# Patient Record
Sex: Female | Born: 1960 | Race: Black or African American | Hispanic: No | Marital: Married | Smoking: Never smoker
Health system: Southern US, Community
[De-identification: ages and names within clinical notes are randomized; demographics above are authoritative.]

## PROBLEM LIST (undated history)

## (undated) DIAGNOSIS — E669 Obesity, unspecified: Secondary | ICD-10-CM

## (undated) DIAGNOSIS — J45909 Unspecified asthma, uncomplicated: Secondary | ICD-10-CM

## (undated) DIAGNOSIS — I10 Essential (primary) hypertension: Secondary | ICD-10-CM

## (undated) HISTORY — PX: TONSILLECTOMY: SUR1361

## (undated) HISTORY — PX: CHOLECYSTECTOMY: SHX55

## (undated) HISTORY — DX: Obesity, unspecified: E66.9

## (undated) HISTORY — PX: SHOULDER SURGERY: SHX246

## (undated) HISTORY — PX: ABDOMINAL HYSTERECTOMY: SHX81

## (undated) HISTORY — DX: Unspecified asthma, uncomplicated: J45.909

---

## 2003-03-13 ENCOUNTER — Encounter: Admission: RE | Admit: 2003-03-13 | Discharge: 2003-03-13 | Payer: Self-pay | Admitting: Internal Medicine

## 2003-06-13 ENCOUNTER — Other Ambulatory Visit: Admission: RE | Admit: 2003-06-13 | Discharge: 2003-06-13 | Payer: Self-pay | Admitting: Internal Medicine

## 2003-12-14 ENCOUNTER — Inpatient Hospital Stay (HOSPITAL_COMMUNITY): Admission: AD | Admit: 2003-12-14 | Discharge: 2003-12-14 | Payer: Self-pay | Admitting: *Deleted

## 2004-06-19 ENCOUNTER — Encounter: Admission: RE | Admit: 2004-06-19 | Discharge: 2004-06-19 | Payer: Self-pay | Admitting: Internal Medicine

## 2004-07-03 ENCOUNTER — Encounter: Admission: RE | Admit: 2004-07-03 | Discharge: 2004-07-03 | Payer: Self-pay | Admitting: Internal Medicine

## 2004-07-15 ENCOUNTER — Ambulatory Visit (HOSPITAL_COMMUNITY): Admission: RE | Admit: 2004-07-15 | Discharge: 2004-07-15 | Payer: Self-pay | Admitting: Gastroenterology

## 2006-12-26 ENCOUNTER — Other Ambulatory Visit: Admission: RE | Admit: 2006-12-26 | Discharge: 2006-12-26 | Payer: Self-pay | Admitting: Obstetrics and Gynecology

## 2007-01-03 ENCOUNTER — Encounter: Admission: RE | Admit: 2007-01-03 | Discharge: 2007-01-03 | Payer: Self-pay | Admitting: Internal Medicine

## 2008-01-05 ENCOUNTER — Encounter: Admission: RE | Admit: 2008-01-05 | Discharge: 2008-01-05 | Payer: Self-pay | Admitting: Internal Medicine

## 2008-07-01 ENCOUNTER — Encounter: Admission: RE | Admit: 2008-07-01 | Discharge: 2008-07-01 | Payer: Self-pay | Admitting: Internal Medicine

## 2009-01-06 ENCOUNTER — Encounter: Admission: RE | Admit: 2009-01-06 | Discharge: 2009-01-06 | Payer: Self-pay | Admitting: Internal Medicine

## 2010-01-14 ENCOUNTER — Encounter
Admission: RE | Admit: 2010-01-14 | Discharge: 2010-01-14 | Payer: Self-pay | Source: Home / Self Care | Attending: Internal Medicine | Admitting: Internal Medicine

## 2010-03-24 ENCOUNTER — Other Ambulatory Visit: Payer: Self-pay | Admitting: Obstetrics and Gynecology

## 2010-03-24 ENCOUNTER — Other Ambulatory Visit (HOSPITAL_COMMUNITY)
Admission: RE | Admit: 2010-03-24 | Discharge: 2010-03-24 | Disposition: A | Discharge status: Home / Self Care | Payer: BC Managed Care – PPO | Source: Ambulatory Visit | Attending: Obstetrics and Gynecology | Admitting: Obstetrics and Gynecology

## 2010-03-24 DIAGNOSIS — Z01419 Encounter for gynecological examination (general) (routine) without abnormal findings: Secondary | ICD-10-CM | POA: Insufficient documentation

## 2010-06-12 NOTE — Op Note (Signed)
Christy Reed, Christy Reed              ACCOUNT NO.:  [id number]   MEDICAL RECORD NO.:  [id number]          PATIENT TYPE:  AMB   LOCATION:  ENDO                         FACILITY:  [facility]   PHYSICIAN:  Danise Edge, M.D.   DATE OF BIRTH:  1960-12-09   DATE OF PROCEDURE:  07/15/2004  DATE OF DISCHARGE:                                 OPERATIVE REPORT   PROCEDURE:  Esophagogastroduodenoscopy.   INDICATIONS:  Ms. Christy Reed is a 50 year old female born April 03, 1960. She underwent a Nissen fundoplication in July 2001 while living in  [place], [place]. She also underwent a cholecystectomy.   Ms. Christy Reed has intermittent solid food dysphagia which is minimal. After  meals, she experiences sharp epigastric pain with nausea. She reports no  odynophagia. There is no history of peptic ulcer disease.   MEDICATION ALLERGIES:  SULFA.   CHRONIC MEDICATIONS:  Diovan and Zyrtec.   PAST MEDICAL - SURGICAL HISTORY:  Hypertension, seasonal rhinitis,  gastroesophageal reflux, Nissen fundoplication July 2001, tinnitus,  cholecystectomy, total abdominal hysterectomy, right bunion surgery,  tonsillectomy, left rotator cuff repair January 2003, colonoscopy 2002.   ENDOSCOPIST:  Danise Edge, M.D.   PREMEDICATION:  Versed 5 mg, Demerol 30 mg.   DESCRIPTION OF PROCEDURE:  After obtaining informed consent, Ms. Christy Reed was  placed in the left lateral decubitus position. I administered intravenous  Demerol and intravenous Versed to achieve conscious sedation for the  procedure. The patient's blood pressure, oxygen saturation and cardiac  rhythm were monitored throughout the procedure and documented in the medical  record.   The Olympus gastroscope was passed through the posterior hypopharynx into  the proximal esophagus without difficulty. The hypopharynx appeared normal.  I was unable to visualize the vocal cords.   ESOPHAGOSCOPY:  The proximal, mid and lower segments of the esophageal  mucosa appear completely normal post Nissen fundoplication. The  squamocolumnar junction and esophagogastric junction are noted at 39 cm from  the incisor teeth. There is no endoscopic evidence for the presence of  Barrett's esophagus, erosive esophagitis or esophageal mucosal scarring.   GASTROSCOPY:  Retroflexed view of the gastric fundus was normal. Retroflexed  view of the gastric cardia reveals an intact fundoplication. The gastric  body, antrum and pylorus appear completely normal.   DUODENOSCOPY:  The duodenal bulb, mid duodenum and distal duodenum appear  normal.   ASSESSMENT:  Normal esophagogastroduodenoscopy post Nissen fundoplication in  July 2001 to treat gastroesophageal reflux.       MJ/MEDQ  D:  07/15/2004  T:  07/15/2004  Job:  [id number]   cc:   Georgann Housekeeper, MD  [address]  [place]  [place] [address]  Fax: [phone]

## 2011-01-15 ENCOUNTER — Other Ambulatory Visit: Payer: Self-pay | Admitting: Internal Medicine

## 2011-01-15 DIAGNOSIS — Z1231 Encounter for screening mammogram for malignant neoplasm of breast: Secondary | ICD-10-CM

## 2011-02-17 ENCOUNTER — Ambulatory Visit: Payer: BC Managed Care – PPO

## 2011-02-18 ENCOUNTER — Ambulatory Visit
Admission: RE | Admit: 2011-02-18 | Discharge: 2011-02-18 | Disposition: A | Discharge status: Home / Self Care | Payer: BC Managed Care – PPO | Source: Ambulatory Visit | Attending: Internal Medicine | Admitting: Internal Medicine

## 2011-02-18 DIAGNOSIS — Z1231 Encounter for screening mammogram for malignant neoplasm of breast: Secondary | ICD-10-CM

## 2012-02-02 ENCOUNTER — Other Ambulatory Visit: Payer: Self-pay | Admitting: Internal Medicine

## 2012-02-02 DIAGNOSIS — Z1231 Encounter for screening mammogram for malignant neoplasm of breast: Secondary | ICD-10-CM

## 2012-03-01 ENCOUNTER — Ambulatory Visit
Admission: RE | Admit: 2012-03-01 | Discharge: 2012-03-01 | Disposition: A | Discharge status: Home / Self Care | Payer: BC Managed Care – PPO | Source: Ambulatory Visit | Attending: Internal Medicine | Admitting: Internal Medicine

## 2012-03-01 DIAGNOSIS — Z1231 Encounter for screening mammogram for malignant neoplasm of breast: Secondary | ICD-10-CM

## 2012-05-03 ENCOUNTER — Emergency Department (HOSPITAL_COMMUNITY)
Admission: EM | Admit: 2012-05-03 | Discharge: 2012-05-03 | Disposition: A | Discharge status: Home / Self Care | Payer: BC Managed Care – PPO | Source: Home / Self Care | Attending: Emergency Medicine | Admitting: Emergency Medicine

## 2012-05-03 ENCOUNTER — Encounter (HOSPITAL_COMMUNITY): Payer: Self-pay | Admitting: Emergency Medicine

## 2012-05-03 DIAGNOSIS — IMO0002 Reserved for concepts with insufficient information to code with codable children: Secondary | ICD-10-CM

## 2012-05-03 DIAGNOSIS — T148XXA Other injury of unspecified body region, initial encounter: Secondary | ICD-10-CM

## 2012-05-03 HISTORY — DX: Essential (primary) hypertension: I10
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
## 2013-02-20 ENCOUNTER — Other Ambulatory Visit: Payer: Self-pay

## 2013-02-20 DIAGNOSIS — Z1231 Encounter for screening mammogram for malignant neoplasm of breast: Secondary | ICD-10-CM

## 2013-03-12 ENCOUNTER — Ambulatory Visit
Admission: RE | Admit: 2013-03-12 | Discharge: 2013-03-12 | Disposition: A | Discharge status: Home / Self Care | Payer: BC Managed Care – PPO | Source: Ambulatory Visit

## 2013-03-12 DIAGNOSIS — Z1231 Encounter for screening mammogram for malignant neoplasm of breast: Secondary | ICD-10-CM

## 2013-04-03 ENCOUNTER — Other Ambulatory Visit: Payer: Self-pay | Admitting: Obstetrics and Gynecology

## 2013-04-03 ENCOUNTER — Other Ambulatory Visit (HOSPITAL_COMMUNITY)
Admission: RE | Admit: 2013-04-03 | Discharge: 2013-04-03 | Disposition: A | Discharge status: Home / Self Care | Payer: BC Managed Care – PPO | Source: Ambulatory Visit | Attending: Obstetrics and Gynecology | Admitting: Obstetrics and Gynecology

## 2013-04-03 DIAGNOSIS — Z01419 Encounter for gynecological examination (general) (routine) without abnormal findings: Secondary | ICD-10-CM | POA: Insufficient documentation

## 2013-04-03 DIAGNOSIS — Z1151 Encounter for screening for human papillomavirus (HPV): Secondary | ICD-10-CM | POA: Insufficient documentation

## 2013-09-03 ENCOUNTER — Ambulatory Visit
Admission: RE | Admit: 2013-09-03 | Discharge: 2013-09-03 | Disposition: A | Discharge status: Home / Self Care | Payer: BC Managed Care – PPO | Source: Ambulatory Visit | Attending: Internal Medicine | Admitting: Internal Medicine

## 2013-09-03 ENCOUNTER — Other Ambulatory Visit: Payer: Self-pay | Admitting: Internal Medicine

## 2013-09-03 DIAGNOSIS — M549 Dorsalgia, unspecified: Secondary | ICD-10-CM

## 2013-10-31 ENCOUNTER — Encounter (HOSPITAL_COMMUNITY): Payer: Self-pay | Admitting: Emergency Medicine

## 2013-10-31 ENCOUNTER — Emergency Department (INDEPENDENT_AMBULATORY_CARE_PROVIDER_SITE_OTHER)
Admission: EM | Admit: 2013-10-31 | Discharge: 2013-10-31 | Disposition: A | Discharge status: Home / Self Care | Payer: Worker's Compensation | Source: Home / Self Care | Attending: Family Medicine | Admitting: Family Medicine

## 2013-10-31 DIAGNOSIS — M542 Cervicalgia: Secondary | ICD-10-CM

## 2013-10-31 DIAGNOSIS — S134XXA Sprain of ligaments of cervical spine, initial encounter: Secondary | ICD-10-CM

## 2013-10-31 MED ORDER — CYCLOBENZAPRINE HCL 10 MG PO TABS: ORAL_TABLET | ORAL | Status: AC

## 2013-10-31 MED ORDER — NAPROXEN 500 MG PO TABS
500.0000 mg | ORAL_TABLET | Freq: Two times a day (BID) | ORAL | Status: AC
Start: 2013-10-31 — End: ?
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
## 2013-11-02 NOTE — ED Provider Notes (Signed)
Medical screening examination/treatment/procedure(s) were performed by a resident physician or non-physician practitioner and as the supervising physician I was immediately available for consultation/collaboration.  Yaacov Koziol, MD    Jessa Stinson S Charmine Bockrath, MD 11/02/13 0817 

## 2014-02-25 ENCOUNTER — Other Ambulatory Visit: Payer: Self-pay

## 2014-02-25 DIAGNOSIS — Z1231 Encounter for screening mammogram for malignant neoplasm of breast: Secondary | ICD-10-CM

## 2014-03-13 ENCOUNTER — Ambulatory Visit
Admission: RE | Admit: 2014-03-13 | Discharge: 2014-03-13 | Disposition: A | Discharge status: Home / Self Care | Payer: BC Managed Care – PPO | Source: Ambulatory Visit

## 2014-03-13 ENCOUNTER — Encounter (INDEPENDENT_AMBULATORY_CARE_PROVIDER_SITE_OTHER): Payer: Self-pay

## 2014-03-13 DIAGNOSIS — Z1231 Encounter for screening mammogram for malignant neoplasm of breast: Secondary | ICD-10-CM

## 2014-03-15 ENCOUNTER — Encounter: Payer: BC Managed Care – PPO | Attending: Internal Medicine | Admitting: Dietician

## 2014-03-15 ENCOUNTER — Encounter: Payer: Self-pay | Admitting: Dietician

## 2014-03-15 DIAGNOSIS — E669 Obesity, unspecified: Secondary | ICD-10-CM

## 2014-03-15 DIAGNOSIS — Z713 Dietary counseling and surveillance: Secondary | ICD-10-CM | POA: Diagnosis present

## 2014-03-15 DIAGNOSIS — Z6839 Body mass index (BMI) 39.0-39.9, adult: Secondary | ICD-10-CM | POA: Diagnosis not present

## 2014-03-15 NOTE — Progress Notes (Signed)
Medical Nutrition Therapy:  Appt start time: 0930 end time:  1030.   Assessment:  Primary concerns today: obesity. 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  She is wanting to have breast reduction surgery and is here for some weight loss guidance.  In her 80s she weighed around 120 lbs,  in her 30s she weighed around 130 lbs, and she has gradually gained weight over time to her highest adult weight at 242 lbs.  She lives with her husband and her mom (who is sick) and her two sisters who moved in to help care for their mom.  Her two sisters do the grocery shopping and she does the majority of the cooking.  They eat in their own rooms or in front of the TV.  She likes to cook balanced meals with a starch, veggie, and meat.  Her sisters like to make fried foods.  She does not currently exercise and is looking to change jobs.  She tends to eat lightly throughout the day and overeat at night, and she states she craves sweet foods/desserts in the evenings. She also had an elevated glucose reading at her last doctor's visit and is awaiting results of HgA1c test.   Preferred Learning Style:  No preference indicated   Learning Readiness:   Contemplating  Ready  MEDICATIONS: see list   DIETARY INTAKE:  Usual eating pattern includes 1-2 meals and 2 snacks per day.  24-hr recall:  B ( AM): breakfast bar and water  Snk ( AM): none  L ( PM): pick up fast food, or make a sandwich at home or skips Snk ( PM): none D (7 PM): depends, either fried or baked protein (depending on who is cooking) with starch and veggie  Snk ( PM): cakes, pies, candy  Usual physical activity: currently none, likes Zumba and would like to find a class she could take, used to walk with a neighbor but gets cramps in legs at night  Estimated energy needs: 1200-1400 calories  Progress Towards Goal(s):  Not yet started   Nutritional Diagnosis:  [place]-3.3 Overweight/obesity As related to mindless  eating with majority of calories consumed later in the day and limited physical activity.  As evidenced by dietary recall, patient report, and BMI of 39.7.    Intervention:  Nutrition education and counseling.  Discussed realistic weight loss rates, recommended 1200-1500 calorie diet for 1-2 lbs of weight loss per week. Stated benefits of exercise, recommend she start a walking routine or begin Zumba at home on [email or web address] (patient liked this idea). Utilized MyPlate to demonstrate a healthy, balanced meal of lean protein, carbohydrate for energy, heart healthy fat, and non-starchy vegetables. Discussed lean protein options. Recommended eating every 3-4 hours to avoid being overly hungry at next meal/in the evening. 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  She also plans to eat lunch earlier (stop skipping) and take a snack on her afternoon route.  Hopefully this will curb her ravenous hunger in the evening by eating more calories earlier in the day with regular timed meals and snacks. Discussed mindful/intuitive eating strategies and recommended they begin eating meals at the table as a family without distractions like TV. Provided healthy snack ideas. Recommended setting small, attainable goals and celebrating successes.  Teaching Method Utilized: Visual Auditory Hands on  Handouts given during visit include:  MyPlate  Low Carb Snack Suggestions  Nutrition Strategies for Weight Loss  Barriers to learning/adherence to lifestyle change: none  Demonstrated degree of understanding via:  Teach Back   Monitoring/Evaluation:  Dietary intake, exercise, labs, and body weight in 3 month(s).

## 2014-04-08 ENCOUNTER — Other Ambulatory Visit: Payer: Self-pay | Admitting: Obstetrics and Gynecology

## 2014-04-08 ENCOUNTER — Other Ambulatory Visit (HOSPITAL_COMMUNITY)
Admission: RE | Admit: 2014-04-08 | Discharge: 2014-04-08 | Disposition: A | Discharge status: Home / Self Care | Payer: BC Managed Care – PPO | Source: Ambulatory Visit | Attending: Obstetrics and Gynecology | Admitting: Obstetrics and Gynecology

## 2014-04-08 DIAGNOSIS — Z01419 Encounter for gynecological examination (general) (routine) without abnormal findings: Secondary | ICD-10-CM | POA: Insufficient documentation

## 2014-04-09 LAB — CYTOLOGY - PAP

## 2014-06-14 ENCOUNTER — Ambulatory Visit: Payer: Self-pay | Admitting: Dietician

## 2014-06-17 ENCOUNTER — Ambulatory Visit: Payer: Self-pay | Admitting: Skilled Nursing Facility1

## 2015-04-01 ENCOUNTER — Ambulatory Visit
Admission: RE | Admit: 2015-04-01 | Discharge: 2015-04-01 | Disposition: A | Discharge status: Home / Self Care | Payer: BC Managed Care – PPO | Source: Ambulatory Visit | Attending: Internal Medicine | Admitting: Internal Medicine

## 2015-04-01 ENCOUNTER — Other Ambulatory Visit: Payer: Self-pay | Admitting: Internal Medicine

## 2015-04-01 DIAGNOSIS — M25552 Pain in left hip: Secondary | ICD-10-CM

## 2015-04-04 ENCOUNTER — Other Ambulatory Visit: Payer: Self-pay

## 2015-04-04 DIAGNOSIS — Z1231 Encounter for screening mammogram for malignant neoplasm of breast: Secondary | ICD-10-CM

## 2015-04-08 ENCOUNTER — Ambulatory Visit
Admission: RE | Admit: 2015-04-08 | Discharge: 2015-04-08 | Disposition: A | Discharge status: Home / Self Care | Payer: BC Managed Care – PPO | Source: Ambulatory Visit

## 2015-04-08 DIAGNOSIS — Z1231 Encounter for screening mammogram for malignant neoplasm of breast: Secondary | ICD-10-CM

## 2015-10-09 ENCOUNTER — Ambulatory Visit (INDEPENDENT_AMBULATORY_CARE_PROVIDER_SITE_OTHER): Payer: BC Managed Care – PPO | Admitting: Podiatry

## 2015-10-09 ENCOUNTER — Ambulatory Visit (INDEPENDENT_AMBULATORY_CARE_PROVIDER_SITE_OTHER): Payer: BC Managed Care – PPO

## 2015-10-09 VITALS — BP 112/72 | HR 79 | Resp 16 | Ht 66.0 in | Wt 240.0 lb

## 2015-10-09 DIAGNOSIS — M722 Plantar fascial fibromatosis: Secondary | ICD-10-CM | POA: Diagnosis not present

## 2015-10-09 MED ORDER — METHYLPREDNISOLONE 4 MG PO TBPK: ORAL_TABLET | ORAL | 0 refills | Status: DC

## 2015-10-09 MED ORDER — MELOXICAM 15 MG PO TABS: 15.0000 mg | ORAL_TABLET | Freq: Every day | ORAL | 3 refills | Status: DC

## 2015-10-09 NOTE — Patient Instructions (Signed)

## 2015-10-09 NOTE — Progress Notes (Signed)
   Subjective:    Patient ID: Christy Reed, female    DOB: [birth year], 55 y.o.   MRN: [id number]  HPI: She presents today with a chief complaint of pain to her right heel times the past 6 months she states that it seems to be worse first thing in the morning and after she's been sitting for a period of time. She denies any trauma denies any treatment plan.    Review of Systems  HENT: Positive for congestion and tinnitus.   Eyes: Positive for itching.  Musculoskeletal: Positive for arthralgias, back pain and gait problem.  Hematological: Bruises/bleeds easily.  All other systems reviewed and are negative.      Objective:   Physical Exam: Vital signs are stable alert and oriented 3. Pulses are palpable. Neurologic sensorium is intact. Deep tendon reflexes are intact. Muscle strength is 5 over 5 dorsiflexion plantar flexors and inverters and everters all edges of musculature is intact. Orthopedic evaluation was resolved once distal to the ankle for range of motion without crepitation. Case evaluation demonstrates supple well-hydrated cutis. No open lesions or wounds. Pain on palpation medial calcaneal tubercle on the right heel. Radiographs do demonstrate a plantar distally oriented calcaneal heel spur with soft tissue increased density at the plantar fascia calcaneal insertion site.     Assessment & Plan:  Plantar fasciitis right heel.  Plan: Injected the right heel today with long local anesthetic. Placed her in plantar fascia brace and night splint. Discussed shoes stretching exercise ice therapy shoe modifications. Started her on methylprednisolone and she will restart her low week which I refilled after that. We discussed the etiology pathology conservative versus surgical therapies. Discussed appropriate shoe gear stretching exercise ice therapy shoe modifications. Follow-up with her in 1 month

## 2015-11-06 ENCOUNTER — Ambulatory Visit: Payer: BC Managed Care – PPO | Admitting: Podiatry

## 2015-11-13 ENCOUNTER — Ambulatory Visit: Payer: BC Managed Care – PPO | Admitting: Podiatry

## 2015-11-25 ENCOUNTER — Encounter: Payer: Self-pay | Admitting: Podiatry

## 2015-11-25 ENCOUNTER — Ambulatory Visit (INDEPENDENT_AMBULATORY_CARE_PROVIDER_SITE_OTHER): Payer: BC Managed Care – PPO | Admitting: Podiatry

## 2015-11-25 VITALS — BP 141/87 | HR 63 | Resp 16

## 2015-11-25 DIAGNOSIS — M722 Plantar fascial fibromatosis: Secondary | ICD-10-CM | POA: Diagnosis not present

## 2015-11-25 MED ORDER — MELOXICAM 15 MG PO TABS: 15.0000 mg | ORAL_TABLET | Freq: Every day | ORAL | 3 refills | Status: AC

## 2015-11-25 NOTE — Progress Notes (Signed)
She presents today one month status post injection right heel for plantar fasciitis. She states that she approximate 80% better stating they really doesn't hurt until I was standing in my kitchen for over an hour with my bedroom slippers on which made it start hurting again. She states that it does not hurt as bad as it did last night. She also does on the same as she did not refill her meloxicam not knowing that there were refills on the bottle.  Objective: Vital signs are stable she is alert and oriented 3. Pulses are palpable. Neurologic sensorium is intact. Deep tendon reflexes are intact. She has pain on palpation medially continued removal of the right heel.  Assessment: Residual plantar fasciitis right.  Plan: Reinjected the right heel again today she will continue plantar fascia brace night splint and I reordered her medications. She will continue to utilize shoe gear modifications in tennis shoes follow up with her in 1 month.

## 2015-12-23 ENCOUNTER — Ambulatory Visit: Payer: BC Managed Care – PPO | Admitting: Podiatry

## 2016-01-01 ENCOUNTER — Encounter (INDEPENDENT_AMBULATORY_CARE_PROVIDER_SITE_OTHER): Payer: BC Managed Care – PPO | Admitting: Podiatry

## 2016-01-01 NOTE — Progress Notes (Signed)
This encounter was created in error - please disregard.

## 2016-03-16 ENCOUNTER — Other Ambulatory Visit: Payer: Self-pay | Admitting: Internal Medicine

## 2016-03-16 DIAGNOSIS — Z1231 Encounter for screening mammogram for malignant neoplasm of breast: Secondary | ICD-10-CM

## 2016-04-08 ENCOUNTER — Ambulatory Visit
Admission: RE | Admit: 2016-04-08 | Discharge: 2016-04-08 | Disposition: A | Discharge status: Home / Self Care | Payer: BC Managed Care – PPO | Source: Ambulatory Visit | Attending: Internal Medicine | Admitting: Internal Medicine

## 2016-04-08 DIAGNOSIS — Z1231 Encounter for screening mammogram for malignant neoplasm of breast: Secondary | ICD-10-CM

## 2016-05-27 ENCOUNTER — Other Ambulatory Visit (HOSPITAL_COMMUNITY)
Admission: RE | Admit: 2016-05-27 | Discharge: 2016-05-27 | Disposition: A | Discharge status: Home / Self Care | Payer: BC Managed Care – PPO | Source: Ambulatory Visit | Attending: Obstetrics and Gynecology | Admitting: Obstetrics and Gynecology

## 2016-05-27 ENCOUNTER — Other Ambulatory Visit: Payer: Self-pay | Admitting: Obstetrics and Gynecology

## 2016-05-27 DIAGNOSIS — Z1151 Encounter for screening for human papillomavirus (HPV): Secondary | ICD-10-CM | POA: Insufficient documentation

## 2016-05-27 DIAGNOSIS — Z01419 Encounter for gynecological examination (general) (routine) without abnormal findings: Secondary | ICD-10-CM | POA: Insufficient documentation

## 2016-05-31 LAB — CYTOLOGY - PAP
Diagnosis: NEGATIVE
HPV (WINDOPATH): NOT DETECTED

## 2017-03-31 ENCOUNTER — Other Ambulatory Visit: Payer: Self-pay | Admitting: Internal Medicine

## 2017-03-31 DIAGNOSIS — Z1231 Encounter for screening mammogram for malignant neoplasm of breast: Secondary | ICD-10-CM

## 2017-04-27 ENCOUNTER — Ambulatory Visit
Admission: RE | Admit: 2017-04-27 | Discharge: 2017-04-27 | Disposition: A | Discharge status: Home / Self Care | Payer: BC Managed Care – PPO | Source: Ambulatory Visit | Attending: Internal Medicine | Admitting: Internal Medicine

## 2017-04-27 DIAGNOSIS — Z1231 Encounter for screening mammogram for malignant neoplasm of breast: Secondary | ICD-10-CM

## 2018-03-22 ENCOUNTER — Other Ambulatory Visit: Payer: Self-pay | Admitting: Internal Medicine

## 2018-03-22 DIAGNOSIS — Z1231 Encounter for screening mammogram for malignant neoplasm of breast: Secondary | ICD-10-CM

## 2018-05-01 ENCOUNTER — Ambulatory Visit: Payer: Self-pay

## 2018-08-14 ENCOUNTER — Other Ambulatory Visit: Payer: Self-pay | Admitting: Internal Medicine

## 2018-08-14 ENCOUNTER — Other Ambulatory Visit: Payer: Self-pay

## 2018-08-14 ENCOUNTER — Ambulatory Visit
Admission: RE | Admit: 2018-08-14 | Discharge: 2018-08-14 | Disposition: A | Discharge status: Home / Self Care | Payer: BC Managed Care – PPO | Source: Ambulatory Visit | Attending: Internal Medicine | Admitting: Internal Medicine

## 2018-08-14 DIAGNOSIS — M545 Low back pain, unspecified: Secondary | ICD-10-CM

## 2018-08-24 ENCOUNTER — Ambulatory Visit: Payer: BC Managed Care – PPO

## 2018-08-25 ENCOUNTER — Ambulatory Visit
Admission: RE | Admit: 2018-08-25 | Discharge: 2018-08-25 | Disposition: A | Discharge status: Home / Self Care | Payer: BC Managed Care – PPO | Source: Ambulatory Visit | Attending: Internal Medicine | Admitting: Internal Medicine

## 2018-08-25 ENCOUNTER — Other Ambulatory Visit: Payer: Self-pay

## 2018-08-25 DIAGNOSIS — Z1231 Encounter for screening mammogram for malignant neoplasm of breast: Secondary | ICD-10-CM

## 2018-09-29 ENCOUNTER — Other Ambulatory Visit: Payer: Self-pay | Admitting: Internal Medicine

## 2018-09-29 DIAGNOSIS — M5136 Other intervertebral disc degeneration, lumbar region: Secondary | ICD-10-CM

## 2018-09-29 DIAGNOSIS — M545 Low back pain, unspecified: Secondary | ICD-10-CM

## 2018-09-29 DIAGNOSIS — M5416 Radiculopathy, lumbar region: Secondary | ICD-10-CM

## 2018-09-29 DIAGNOSIS — M79604 Pain in right leg: Secondary | ICD-10-CM

## 2018-09-29 DIAGNOSIS — M51369 Other intervertebral disc degeneration, lumbar region without mention of lumbar back pain or lower extremity pain: Secondary | ICD-10-CM

## 2018-10-25 ENCOUNTER — Ambulatory Visit (HOSPITAL_COMMUNITY)
Admission: EM | Admit: 2018-10-25 | Discharge: 2018-10-25 | Discharge status: Against Medical Advice | Payer: BC Managed Care – PPO

## 2018-10-26 ENCOUNTER — Other Ambulatory Visit: Payer: BC Managed Care – PPO

## 2019-08-09 ENCOUNTER — Other Ambulatory Visit: Payer: Self-pay | Admitting: Internal Medicine

## 2019-08-09 DIAGNOSIS — Z1231 Encounter for screening mammogram for malignant neoplasm of breast: Secondary | ICD-10-CM

## 2019-08-27 ENCOUNTER — Ambulatory Visit
Admission: RE | Admit: 2019-08-27 | Discharge: 2019-08-27 | Disposition: A | Discharge status: Home / Self Care | Payer: BC Managed Care – PPO | Source: Ambulatory Visit | Attending: Internal Medicine | Admitting: Internal Medicine

## 2019-08-27 ENCOUNTER — Other Ambulatory Visit: Payer: Self-pay

## 2019-08-27 DIAGNOSIS — Z1231 Encounter for screening mammogram for malignant neoplasm of breast: Secondary | ICD-10-CM

## 2020-08-08 ENCOUNTER — Other Ambulatory Visit: Payer: Self-pay | Admitting: Internal Medicine

## 2020-08-08 DIAGNOSIS — Z1231 Encounter for screening mammogram for malignant neoplasm of breast: Secondary | ICD-10-CM

## 2020-10-03 ENCOUNTER — Ambulatory Visit
Admission: RE | Admit: 2020-10-03 | Discharge: 2020-10-03 | Disposition: A | Discharge status: Home / Self Care | Payer: BC Managed Care – PPO | Source: Ambulatory Visit | Attending: Internal Medicine | Admitting: Internal Medicine

## 2020-10-03 ENCOUNTER — Other Ambulatory Visit: Payer: Self-pay

## 2020-10-03 DIAGNOSIS — Z1231 Encounter for screening mammogram for malignant neoplasm of breast: Secondary | ICD-10-CM

## 2020-12-19 ENCOUNTER — Other Ambulatory Visit: Payer: Self-pay | Admitting: Internal Medicine

## 2020-12-19 ENCOUNTER — Other Ambulatory Visit (HOSPITAL_COMMUNITY): Payer: Self-pay | Admitting: Internal Medicine

## 2020-12-19 DIAGNOSIS — K2100 Gastro-esophageal reflux disease with esophagitis, without bleeding: Secondary | ICD-10-CM

## 2021-01-01 ENCOUNTER — Other Ambulatory Visit: Payer: Self-pay | Admitting: Internal Medicine

## 2021-01-01 ENCOUNTER — Other Ambulatory Visit (HOSPITAL_COMMUNITY): Payer: Self-pay | Admitting: Internal Medicine

## 2021-01-01 ENCOUNTER — Other Ambulatory Visit: Payer: Self-pay

## 2021-01-01 ENCOUNTER — Ambulatory Visit (HOSPITAL_COMMUNITY)
Admission: RE | Admit: 2021-01-01 | Discharge: 2021-01-01 | Disposition: A | Discharge status: Home / Self Care | Payer: BC Managed Care – PPO | Source: Ambulatory Visit | Attending: Internal Medicine | Admitting: Internal Medicine

## 2021-01-01 DIAGNOSIS — K2100 Gastro-esophageal reflux disease with esophagitis, without bleeding: Secondary | ICD-10-CM

## 2021-08-26 ENCOUNTER — Other Ambulatory Visit: Payer: Self-pay | Admitting: Internal Medicine

## 2021-08-26 DIAGNOSIS — Z1231 Encounter for screening mammogram for malignant neoplasm of breast: Secondary | ICD-10-CM

## 2021-10-06 ENCOUNTER — Ambulatory Visit
Admission: RE | Admit: 2021-10-06 | Discharge: 2021-10-06 | Disposition: A | Discharge status: Home / Self Care | Payer: BC Managed Care – PPO | Source: Ambulatory Visit | Attending: Internal Medicine | Admitting: Internal Medicine

## 2021-10-06 DIAGNOSIS — Z1231 Encounter for screening mammogram for malignant neoplasm of breast: Secondary | ICD-10-CM

## 2021-10-15 ENCOUNTER — Ambulatory Visit: Payer: Self-pay

## 2021-10-15 ENCOUNTER — Other Ambulatory Visit: Payer: Self-pay | Admitting: Family Medicine

## 2021-10-15 DIAGNOSIS — M79641 Pain in right hand: Secondary | ICD-10-CM

## 2021-10-15 DIAGNOSIS — M25561 Pain in right knee: Secondary | ICD-10-CM

## 2022-02-07 IMAGING — RF DG ESOPHAGUS
8 of 10 series · 15 of 24 positions shown · non-contrast
Comparison: None.

CLINICAL DATA: Esophageal dysphagia.

EXAM:
ESOPHOGRAM/BARIUM SWALLOW
TECHNIQUE: Single contrast examination was performed using thin and thick
barium.
FLUOROSCOPY TIME:  Fluoroscopy Time:  2 minutes and 30 seconds
Radiation Exposure Index (if provided by the fluoroscopic device):
89.5 mGy
Number of Acquired Spot Images: 0

[Series 1: cp_standard · 0.34mm/px · 2 of 56 frames shown (1 of 8)]
[frame 9/56]
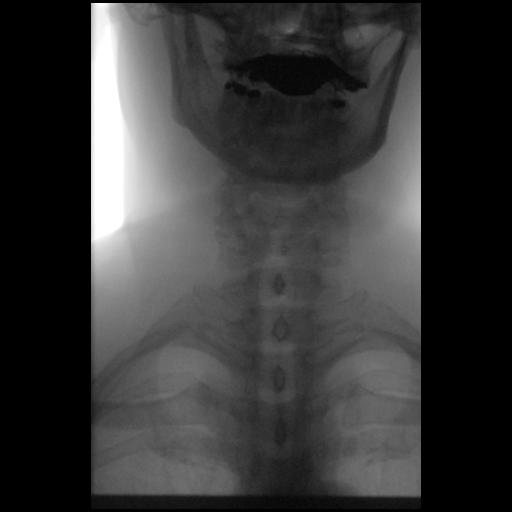
[frame 48/56]
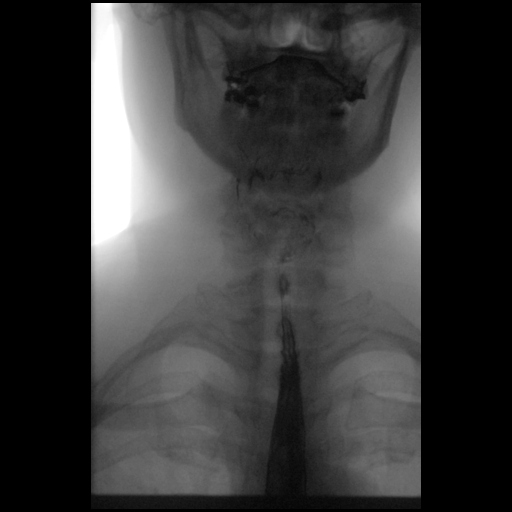

[Series 2: cp_standard · 0.34mm/px · 2 of 38 frames shown (2 of 8)]
[frame 20/38]
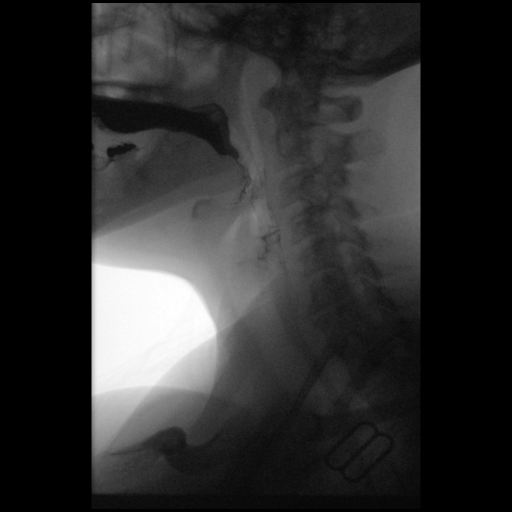
[frame 33/38]
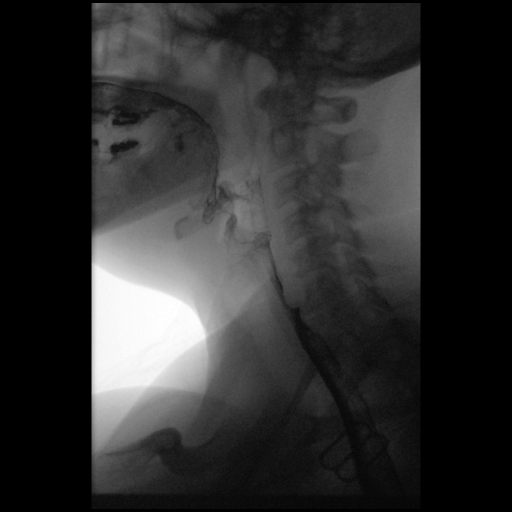

[Series 3: cp_standard · 0.34mm/px · 2 of 171 frames shown (3 of 8)]
[frame 26/171]
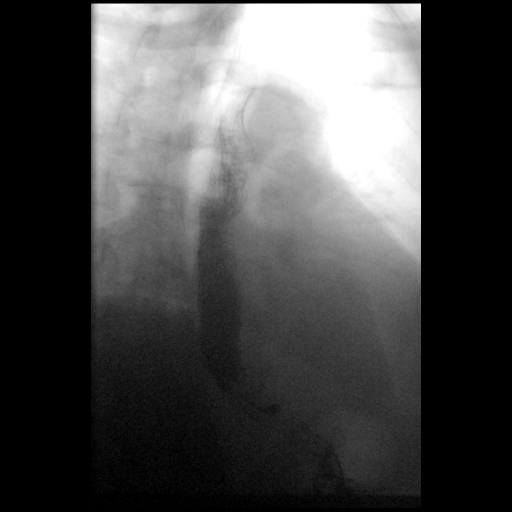
[frame 146/171]
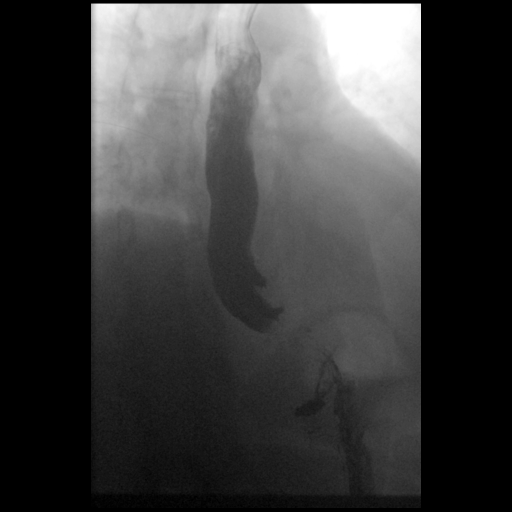

[Series 5: cp_standard · 0.34mm/px · 2 of 20 frames shown (4 of 8)]
[frame 4/20]
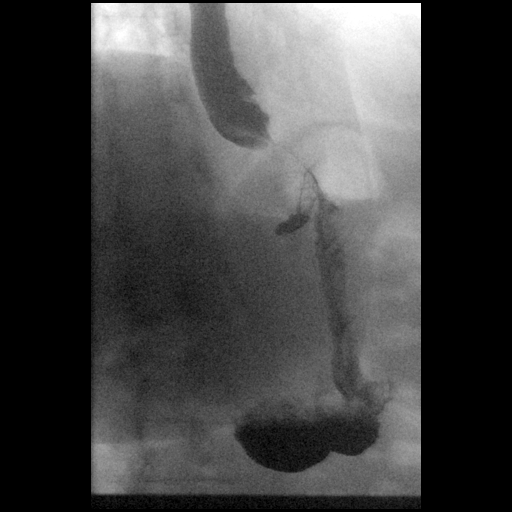
[frame 20/20]
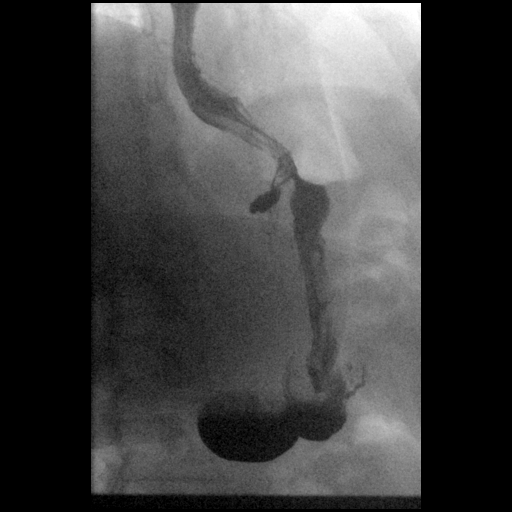

[Series 6: cp_standard · 0.35mm/px · 2 of 167 frames shown (5 of 8)]
[frame 26/167]
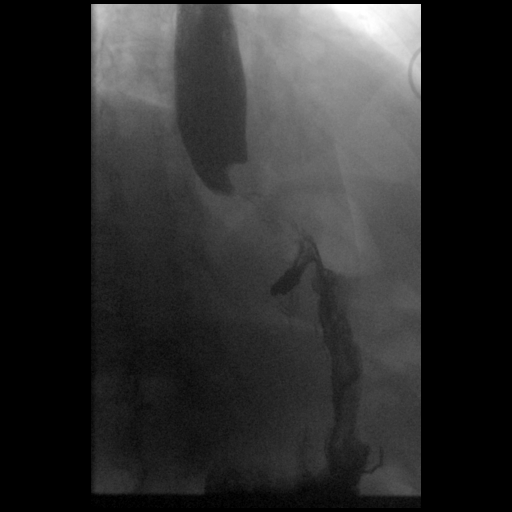
[frame 142/167]
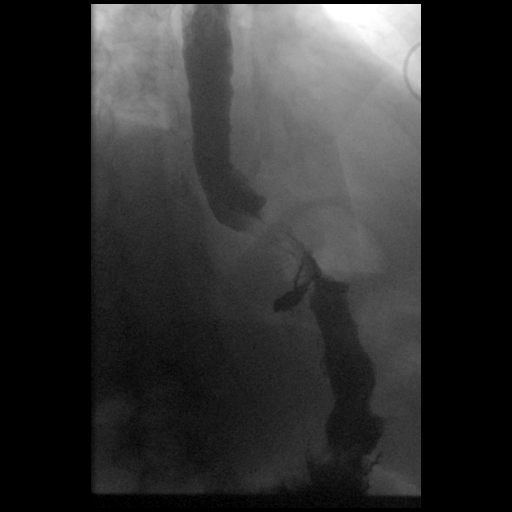

[Series 8: cp_standard · 0.35mm/px · 2 of 110 frames shown (6 of 8)]
[frame 17/110]
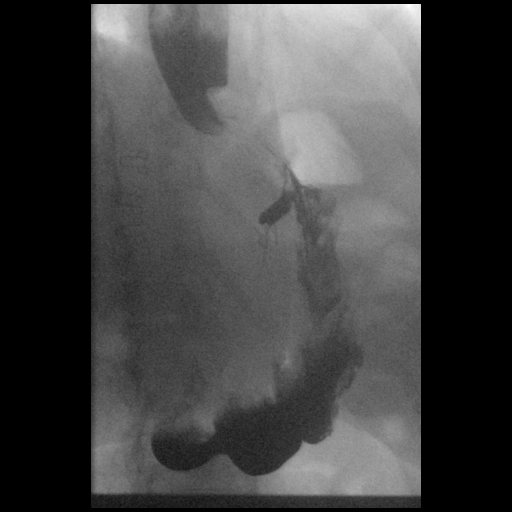
[frame 56/110]
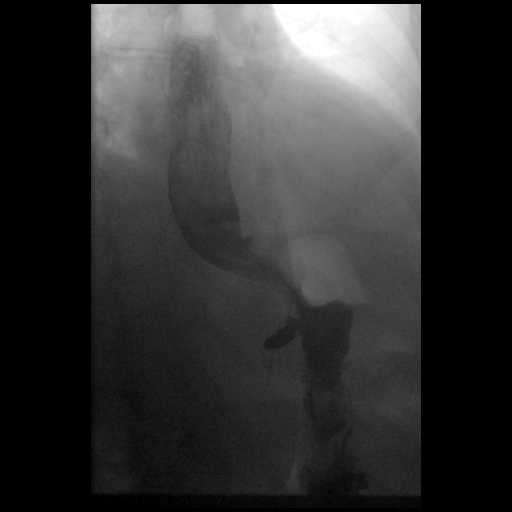

[Series 10: cp_standard · 0.35mm/px · 2 of 129 frames shown (7 of 8)]
[frame 20/129]
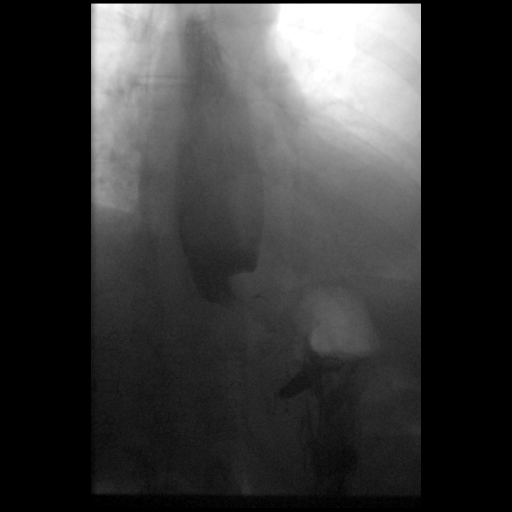
[frame 59/129]
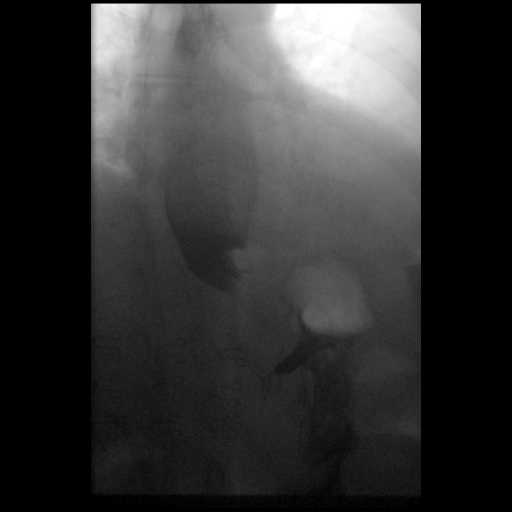

[Series 11: cp_standard · 0.17mm/px · 1 of 1 slices shown (8 of 8)]
[im 1/1]
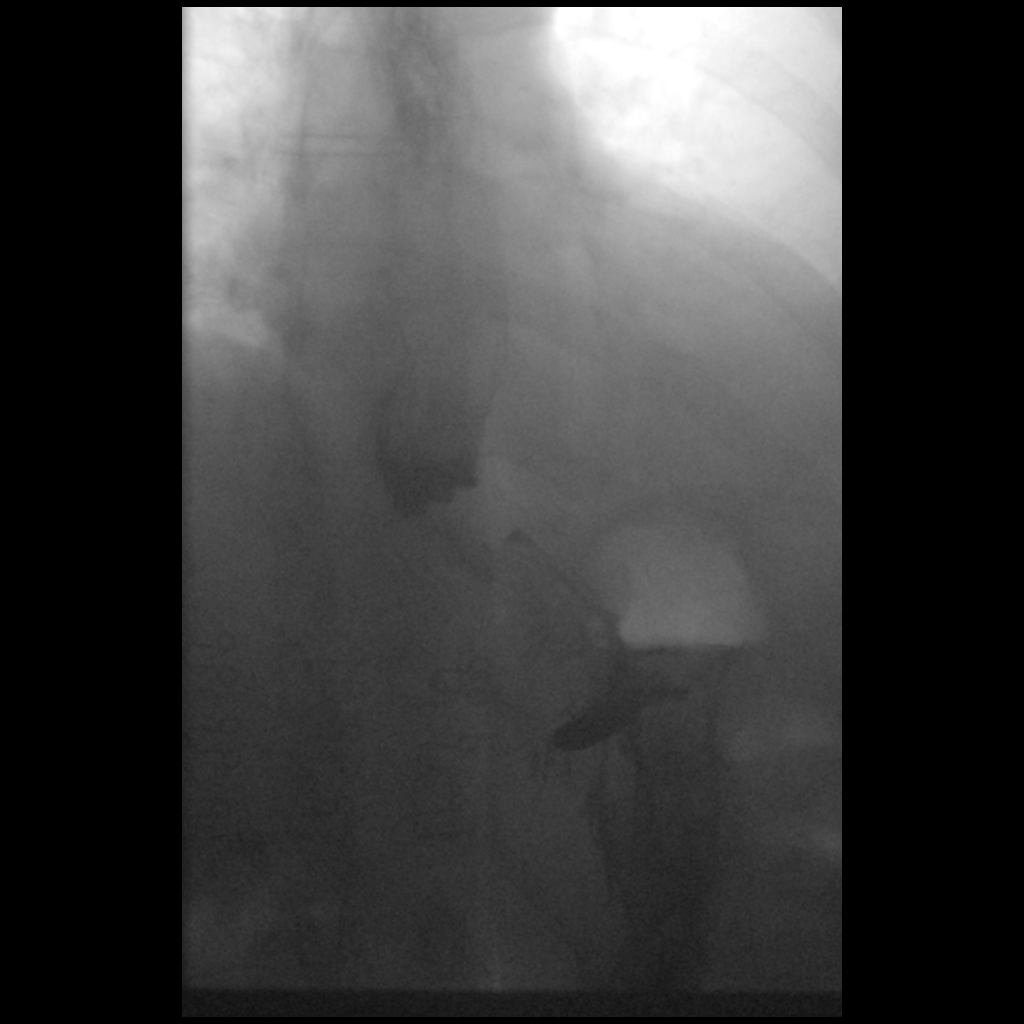

[15 of 24 positions shown; findings below may reference images not displayed]

FINDINGS: Initial barium swallows demonstrate normal pharyngeal motion with
swallowing. No laryngeal penetration or aspiration. No upper
esophageal webs, strictures or diverticuli.

Esophageal dysmotility with disruption of the primary peristaltic
wave and occasional tertiary contractions. There is moderate mid
distal esophageal stasis and there were food particles in the
esophagus. Persistent impression along the anterior aspect of the
distal esophagus could be surgical change. A polyp is also possible.
Recommend correlation with recent endoscopy. I do not see any
obvious stricture at the GE junction. The 13 mm barium pill did pass
into the stomach. Mild esophageal reflux was demonstrated with water
swallowing.
IMPRESSION: 1. Esophageal dysmotility with mid and distal esophageal stasis.
2. Persistent impression along the anterior aspect of the distal
esophagus could be surgical change. A polyp is also possible.
Recommend correlation with recent endoscopy.
3. No obvious stricture at the GE junction. The 13 mm barium pill
did pass into the stomach.
4. Inducible GE reflux.

## 2022-08-31 ENCOUNTER — Other Ambulatory Visit: Payer: Self-pay

## 2022-08-31 MED ORDER — OZEMPIC (0.25 OR 0.5 MG/DOSE) 2 MG/3ML ~~LOC~~ SOPN
0.2500 mg | PEN_INJECTOR | SUBCUTANEOUS | 5 refills | Status: AC
  Filled 2022-08-31: qty 3, 28d supply, fill #0

## 2022-09-03 ENCOUNTER — Other Ambulatory Visit: Payer: Self-pay

## 2022-09-23 ENCOUNTER — Other Ambulatory Visit: Payer: Self-pay

## 2022-09-30 ENCOUNTER — Other Ambulatory Visit: Payer: Self-pay | Admitting: Internal Medicine

## 2022-09-30 DIAGNOSIS — Z1231 Encounter for screening mammogram for malignant neoplasm of breast: Secondary | ICD-10-CM

## 2022-10-27 ENCOUNTER — Ambulatory Visit
Admission: RE | Admit: 2022-10-27 | Discharge: 2022-10-27 | Disposition: A | Discharge status: Home / Self Care | Payer: BC Managed Care – PPO | Source: Ambulatory Visit | Attending: Internal Medicine | Admitting: Internal Medicine

## 2022-10-27 DIAGNOSIS — Z1231 Encounter for screening mammogram for malignant neoplasm of breast: Secondary | ICD-10-CM

## 2023-05-31 ENCOUNTER — Other Ambulatory Visit: Payer: Self-pay

## 2023-05-31 MED ORDER — TRAMADOL HCL 50 MG PO TABS
50.0000 mg | ORAL_TABLET | Freq: Three times a day (TID) | ORAL | 2 refills | Status: AC | PRN
  Filled 2023-05-31: qty 60, 20d supply, fill #0
  Filled 2023-11-23 (×2): qty 60, 20d supply, fill #1

## 2023-06-01 ENCOUNTER — Other Ambulatory Visit: Payer: Self-pay

## 2023-10-06 ENCOUNTER — Other Ambulatory Visit: Payer: Self-pay | Admitting: Internal Medicine

## 2023-10-06 DIAGNOSIS — Z1231 Encounter for screening mammogram for malignant neoplasm of breast: Secondary | ICD-10-CM

## 2023-10-28 ENCOUNTER — Ambulatory Visit
Admission: RE | Admit: 2023-10-28 | Discharge: 2023-10-28 | Disposition: A | Discharge status: Home / Self Care | Source: Ambulatory Visit | Attending: Internal Medicine | Admitting: Internal Medicine

## 2023-10-28 DIAGNOSIS — Z1231 Encounter for screening mammogram for malignant neoplasm of breast: Secondary | ICD-10-CM

## 2023-11-23 ENCOUNTER — Other Ambulatory Visit: Payer: Self-pay

## 2023-11-23 ENCOUNTER — Other Ambulatory Visit (HOSPITAL_COMMUNITY): Payer: Self-pay
